# Patient Record
Sex: Male | Born: 1985 | Race: White | Hispanic: No | Marital: Single | Smoking: Never smoker
Health system: Southern US, Community
[De-identification: ages and names within clinical notes are randomized; demographics above are authoritative.]

## PROBLEM LIST (undated history)

## (undated) DIAGNOSIS — K219 Gastro-esophageal reflux disease without esophagitis: Secondary | ICD-10-CM

## (undated) DIAGNOSIS — F909 Attention-deficit hyperactivity disorder, unspecified type: Secondary | ICD-10-CM

## (undated) DIAGNOSIS — F319 Bipolar disorder, unspecified: Secondary | ICD-10-CM

---
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
## 2007-06-23 ENCOUNTER — Emergency Department (HOSPITAL_COMMUNITY): Admission: EM | Admit: 2007-06-23 | Discharge: 2007-06-23 | Payer: Self-pay | Admitting: Emergency Medicine

## 2007-09-30 ENCOUNTER — Emergency Department: Payer: Self-pay | Admitting: Emergency Medicine

## 2007-10-21 ENCOUNTER — Emergency Department (HOSPITAL_COMMUNITY): Admission: EM | Admit: 2007-10-21 | Discharge: 2007-10-21 | Payer: Self-pay | Admitting: *Deleted

## 2008-01-10 ENCOUNTER — Emergency Department (HOSPITAL_COMMUNITY): Admission: EM | Admit: 2008-01-10 | Discharge: 2008-01-10 | Payer: Self-pay | Admitting: Emergency Medicine

## 2008-01-30 ENCOUNTER — Emergency Department (HOSPITAL_COMMUNITY): Admission: EM | Admit: 2008-01-30 | Discharge: 2008-01-30 | Payer: Self-pay | Admitting: Emergency Medicine

## 2008-04-14 ENCOUNTER — Emergency Department: Payer: Self-pay | Admitting: Emergency Medicine

## 2008-07-17 ENCOUNTER — Emergency Department (HOSPITAL_COMMUNITY): Admission: EM | Admit: 2008-07-17 | Discharge: 2008-07-17 | Payer: Self-pay | Admitting: Family Medicine

## 2008-12-16 IMAGING — CR LEFT WRIST - COMPLETE 3+ VIEW
1 series · 4 of 4 positions shown · non-contrast
Comparison: none

REASON FOR EXAM: Fall
COMMENTS:   LMP: (Male)

PROCEDURE:     DXR - DXR WRIST LT COMP WITH OBLIQUES  - April 14, 2008  [DATE]
RESULT:     Images of the LEFT wrist demonstrate no fracture, dislocation or
radiopaque foreign body.

[Series 1: view not recorded · 0.17mm/px · 4 of 4 slices shown]
[im 1/4]
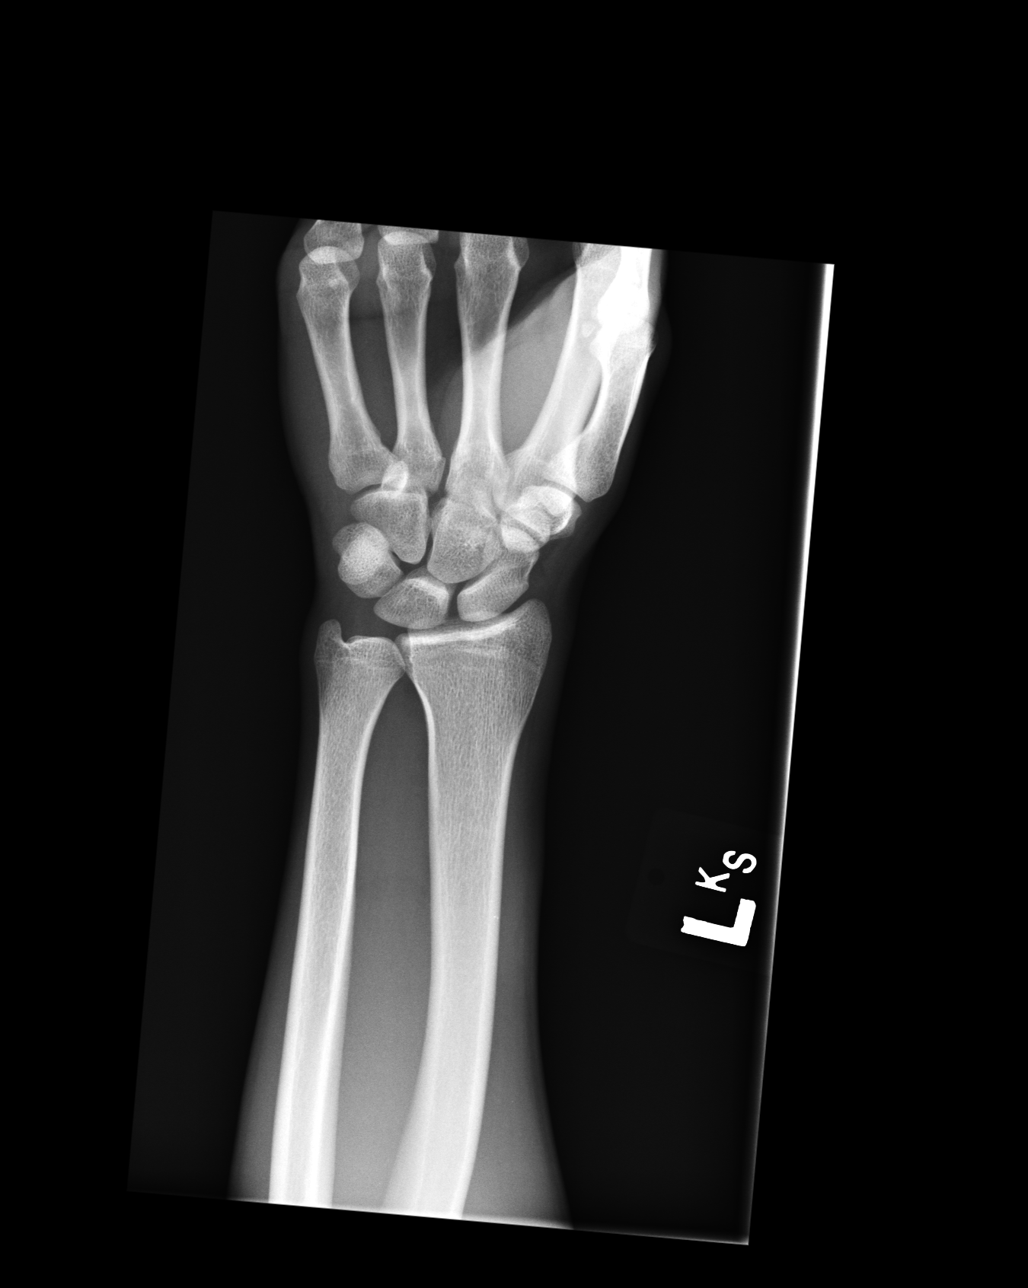
[im 2/4]
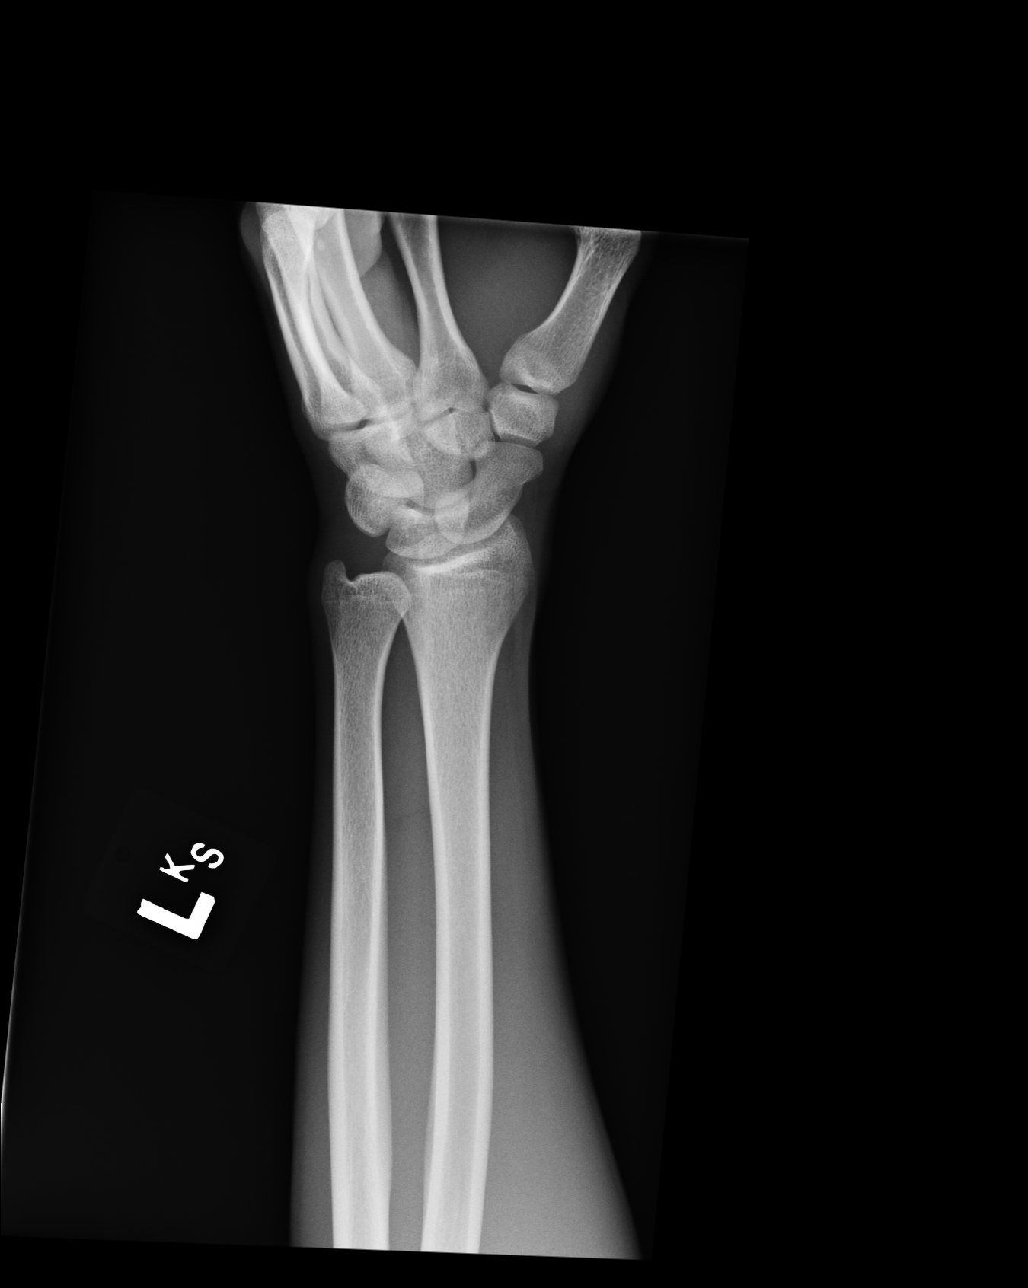
[im 3/4]
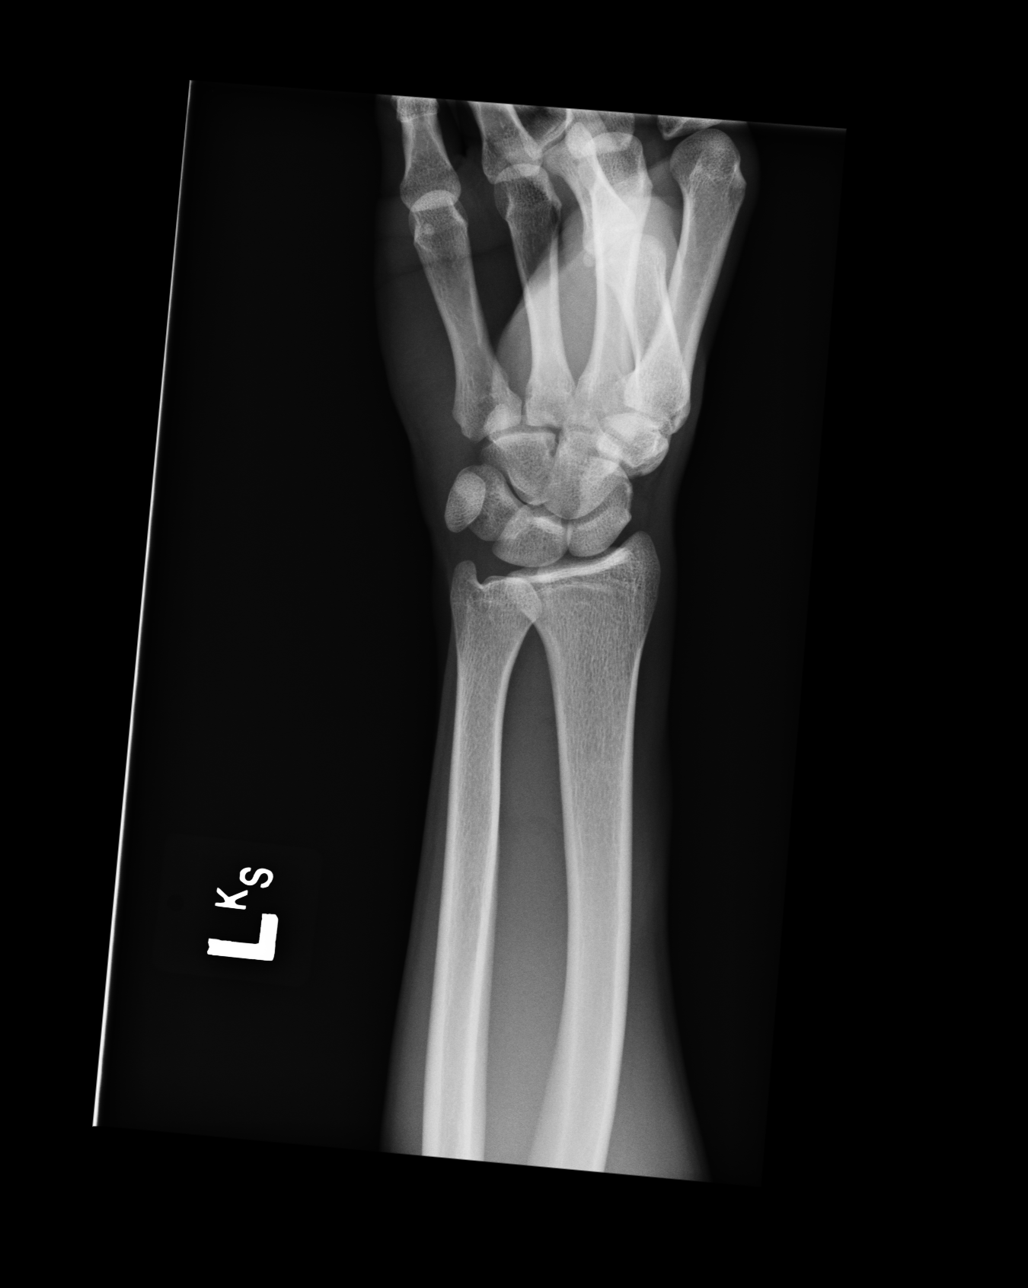
[im 4/4]
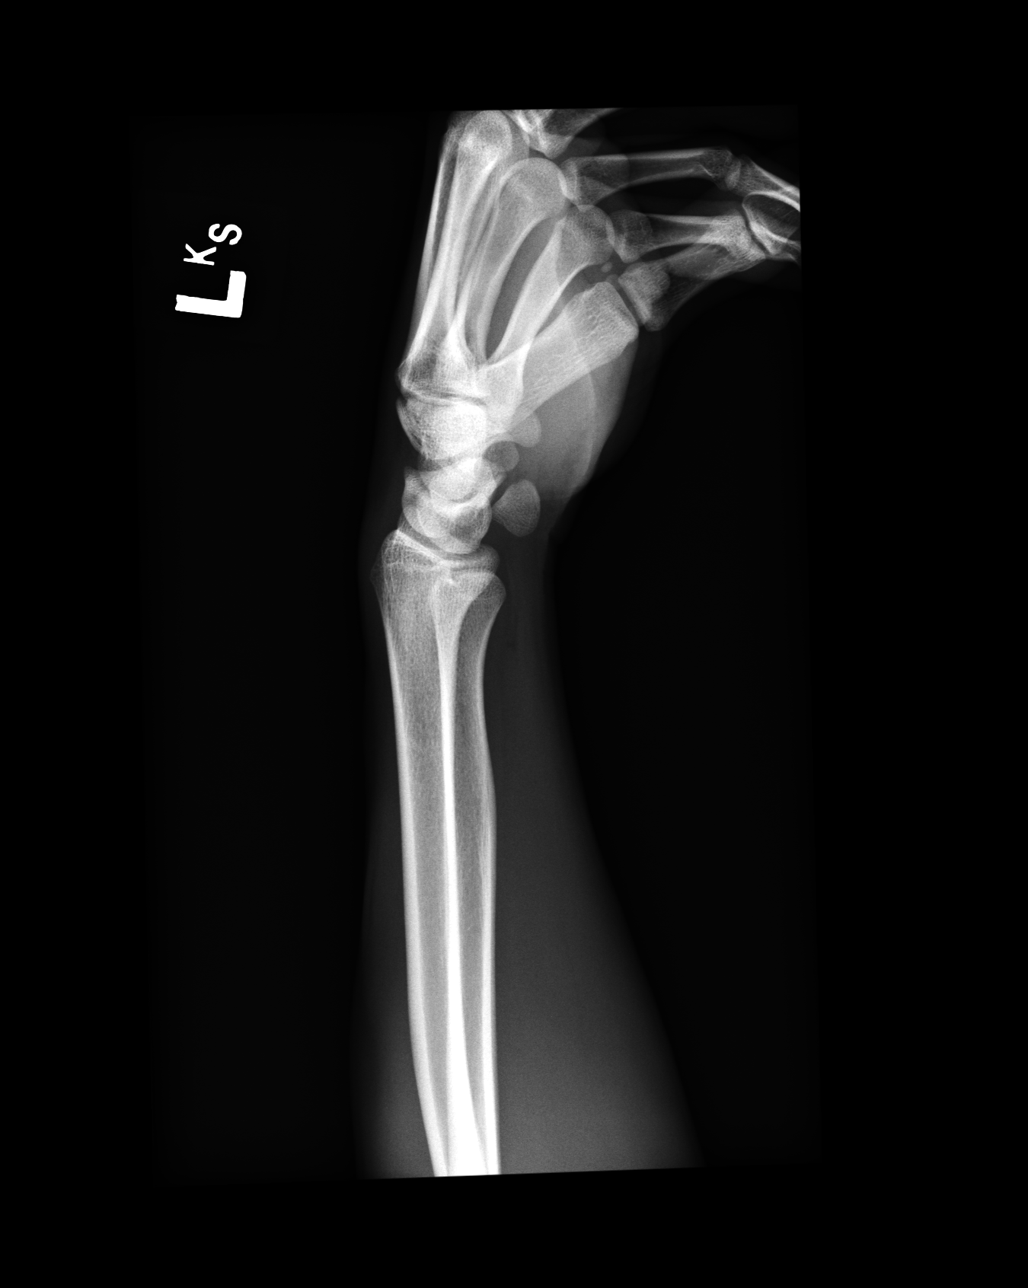

[4 of 4 positions shown; findings below may reference images not displayed]

IMPRESSION: No acute bony abnormality demonstrated.

## 2009-10-25 ENCOUNTER — Emergency Department: Payer: Self-pay | Admitting: Internal Medicine

## 2009-12-04 ENCOUNTER — Emergency Department: Payer: Self-pay | Admitting: Internal Medicine

## 2009-12-17 ENCOUNTER — Emergency Department: Payer: Self-pay | Admitting: Emergency Medicine

## 2009-12-24 ENCOUNTER — Emergency Department: Payer: Self-pay | Admitting: Unknown Physician Specialty

## 2011-04-06 ENCOUNTER — Emergency Department: Payer: Self-pay | Admitting: *Deleted

## 2011-09-28 ENCOUNTER — Ambulatory Visit: Payer: Self-pay

## 2012-01-25 ENCOUNTER — Emergency Department (HOSPITAL_COMMUNITY): Payer: Medicare Other

## 2012-01-25 ENCOUNTER — Encounter (HOSPITAL_COMMUNITY): Payer: Self-pay

## 2012-01-25 ENCOUNTER — Emergency Department (HOSPITAL_COMMUNITY)
Admission: EM | Admit: 2012-01-25 | Discharge: 2012-01-25 | Disposition: A | Discharge status: Home / Self Care | Payer: Medicare Other | Attending: Emergency Medicine | Admitting: Emergency Medicine

## 2012-01-25 DIAGNOSIS — M79609 Pain in unspecified limb: Secondary | ICD-10-CM | POA: Insufficient documentation

## 2012-01-25 DIAGNOSIS — M79642 Pain in left hand: Secondary | ICD-10-CM

## 2012-01-25 MED ORDER — OXYCODONE-ACETAMINOPHEN 5-325 MG PO TABS
1.0000 | ORAL_TABLET | Freq: Once | ORAL | Status: AC
  Administered 2012-01-25: 1 via ORAL
  Filled 2012-01-25: qty 1

## 2012-01-25 MED ORDER — IBUPROFEN 800 MG PO TABS
800.0000 mg | ORAL_TABLET | Freq: Once | ORAL | Status: AC
  Administered 2012-01-25: 800 mg via ORAL
  Filled 2012-01-25: qty 1

## 2012-01-25 MED ORDER — OXYCODONE-ACETAMINOPHEN 5-325 MG PO TABS: 2.0000 | ORAL_TABLET | ORAL | Status: AC | PRN

## 2012-01-25 NOTE — Discharge Instructions (Signed)
Splint, ice, elevate, pain medications, followup with hand Dr. in [place];  you'll need to call to make appointment
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
## 2012-01-25 NOTE — ED Notes (Signed)
CD and other xray info from urgent care office returned to patient to share with ortho MD

## 2012-03-26 ENCOUNTER — Emergency Department (HOSPITAL_COMMUNITY)
Admission: EM | Admit: 2012-03-26 | Discharge: 2012-03-26 | Disposition: A | Discharge status: Home / Self Care | Payer: Medicare Other | Attending: Emergency Medicine | Admitting: Emergency Medicine

## 2012-03-26 ENCOUNTER — Encounter (HOSPITAL_COMMUNITY): Payer: Self-pay | Admitting: *Deleted

## 2012-03-26 DIAGNOSIS — K029 Dental caries, unspecified: Secondary | ICD-10-CM | POA: Insufficient documentation

## 2012-03-26 MED ORDER — PENICILLIN V POTASSIUM 500 MG PO TABS: 500.0000 mg | ORAL_TABLET | Freq: Three times a day (TID) | ORAL | Status: AC

## 2012-03-26 MED ORDER — HYDROCODONE-ACETAMINOPHEN 5-325 MG PO TABS
1.0000 | ORAL_TABLET | Freq: Once | ORAL | Status: AC
  Administered 2012-03-26: 1 via ORAL
  Filled 2012-03-26: qty 1

## 2012-03-26 MED ORDER — HYDROCODONE-ACETAMINOPHEN 5-325 MG PO TABS: 1.0000 | ORAL_TABLET | ORAL | Status: AC | PRN

## 2012-03-26 MED ORDER — PENICILLIN V POTASSIUM 250 MG PO TABS
500.0000 mg | ORAL_TABLET | Freq: Once | ORAL | Status: AC
  Administered 2012-03-26: 500 mg via ORAL
  Filled 2012-03-26: qty 2

## 2012-03-26 NOTE — ED Notes (Signed)
Toothache to left upper since yesterday.

## 2012-03-26 NOTE — ED Provider Notes (Signed)
Medical screening examination/treatment/procedure(s) were performed by non-physician practitioner and as supervising physician I was immediately available for consultation/collaboration.   Stryker Veasey L Calani Gick, MD 03/26/12 1940 

## 2012-03-26 NOTE — Discharge Instructions (Signed)
Complete your entire course of antibiotics as prescribed.  You  may use the hydrocodone for pain relief but do not drive within 4 hours of taking as this will make you drowsy.  Avoid applying heat or ice to this abscess area which can worsen your symptoms.  You may use warm salt water swish and spit treatment or half peroxide and water swish and spit after meals to keep this area clean as discussed.  See the dental referral list given.

## 2012-03-26 NOTE — ED Provider Notes (Signed)
History     CSN: [id number]  Arrival date & time 03/26/12  1810   First MD Initiated Contact with Patient 03/26/12 1815      Chief Complaint  Patient presents with  . Dental Pain    (Consider location/radiation/quality/duration/timing/severity/associated sxs/prior treatment) HPI Comments: Michael Lamb has developed dental pain at the site of known old cavity since yesterday.  He denies fevers,  Chills,  Gingival edema or drainage from the tooth.  He does not have a dentist,  But plans to establish care this week with one.  Pain is constant and throbbing.  He has tried ibuprofen and bc powders without relief.  The history is provided by the patient.    History reviewed. No pertinent past medical history.  History reviewed. No pertinent past surgical history.  No family history on file.  History  Substance Use Topics  . Smoking status: Never Smoker   . Smokeless tobacco: Not on file  . Alcohol Use: No      Review of Systems  Constitutional: Negative for fever.  HENT: Positive for dental problem. Negative for sore throat, facial swelling, neck pain and neck stiffness.   Respiratory: Negative for shortness of breath.     Allergies  Review of patient's allergies indicates no known allergies.  Home Medications   Current Outpatient Rx  Name Route Sig Dispense Refill  . HYDROCODONE-ACETAMINOPHEN 5-325 MG PO TABS Oral Take 1 tablet by mouth every 4 (four) hours as needed for pain. 15 tablet 0  . PENICILLIN V POTASSIUM 500 MG PO TABS Oral Take 1 tablet (500 mg total) by mouth 3 (three) times daily. 30 tablet 0    BP 129/72  Pulse 62  Temp(Src) 98.3 F (36.8 C) (Oral)  Resp 16  SpO2 99%  Physical Exam  Constitutional: He is oriented to person, place, and time. He appears well-developed and well-nourished. No distress.  HENT:  Head: Normocephalic and atraumatic.  Right Ear: Tympanic membrane and external ear normal.  Left Ear: Tympanic membrane and external ear  normal.  Mouth/Throat: Oropharynx is clear and moist and mucous membranes are normal. No oral lesions. Dental abscesses present.         Pain in and surrounding this tooth with deep cavity noted.  Deep cavity also noted in right upper 2nd molar tooth.  No gingival edema or erythema.  Eyes: Conjunctivae are normal.  Neck: Normal range of motion. Neck supple.  Cardiovascular: Normal rate and normal heart sounds.   Pulmonary/Chest: Effort normal.  Abdominal: He exhibits no distension.  Musculoskeletal: Normal range of motion.  Lymphadenopathy:    He has no cervical adenopathy.  Neurological: He is alert and oriented to person, place, and time.  Skin: Skin is warm and dry. No erythema.  Psychiatric: He has a normal mood and affect.    ED Course  Procedures (including critical care time)  Labs Reviewed - No data to display No results found.   1. Dental caries       MDM  Probable simple dental pain although with old cavities and new pain,  Suspicious for possible early infection - will cover for this possibility with PCN.  Dental referrals given.  Hydrocodone.        Burgess Amor, [place] 03/26/12 [phone]

## 2012-03-27 ENCOUNTER — Emergency Department (HOSPITAL_COMMUNITY)
Admission: EM | Admit: 2012-03-27 | Discharge: 2012-03-27 | Disposition: A | Discharge status: Home / Self Care | Payer: Medicare Other | Attending: Emergency Medicine | Admitting: Emergency Medicine

## 2012-03-27 ENCOUNTER — Encounter (HOSPITAL_COMMUNITY): Payer: Self-pay | Admitting: *Deleted

## 2012-03-27 DIAGNOSIS — K047 Periapical abscess without sinus: Secondary | ICD-10-CM | POA: Insufficient documentation

## 2012-03-27 DIAGNOSIS — K0889 Other specified disorders of teeth and supporting structures: Secondary | ICD-10-CM

## 2012-03-27 MED ORDER — IBUPROFEN 600 MG PO TABS: 600.0000 mg | ORAL_TABLET | Freq: Three times a day (TID) | ORAL | Status: DC | PRN

## 2012-03-27 MED ORDER — OXYCODONE-ACETAMINOPHEN 5-325 MG PO TABS: 1.0000 | ORAL_TABLET | ORAL | Status: AC | PRN

## 2012-03-27 MED ORDER — OXYCODONE-ACETAMINOPHEN 5-325 MG PO TABS
1.0000 | ORAL_TABLET | Freq: Once | ORAL | Status: AC
  Administered 2012-03-27: 1 via ORAL
  Filled 2012-03-27: qty 1

## 2012-03-27 MED ORDER — IBUPROFEN 800 MG PO TABS
800.0000 mg | ORAL_TABLET | Freq: Once | ORAL | Status: AC
  Administered 2012-03-27: 800 mg via ORAL
  Filled 2012-03-27: qty 1

## 2012-03-27 NOTE — ED Notes (Signed)
PA at bedside to assess pt.

## 2012-03-27 NOTE — ED Provider Notes (Signed)
Medical screening examination/treatment/procedure(s) were performed by non-physician practitioner and as supervising physician I was immediately available for consultation/collaboration. Devoria Albe, MD, Armando Gang   Ward Givens, MD 03/27/12 [phone]

## 2012-03-27 NOTE — ED Notes (Signed)
Discharge instructions reviewed with pt, questions answered. Pt verbalized understanding.  

## 2012-03-27 NOTE — ED Notes (Signed)
Upper lt molar painful, pt says it is his wisdom tooth causing pain. Seen here for same. Says he does not feel any better and cannot get into dentists office until next week.

## 2012-03-27 NOTE — ED Provider Notes (Signed)
History     CSN: [id number]  Arrival date & time 03/27/12  2042   First MD Initiated Contact with Patient 03/27/12 2139      Chief Complaint  Patient presents with  . Dental Pain    (Consider location/radiation/quality/duration/timing/severity/associated sxs/prior treatment) HPI Comments: Leonardo Makris returns for further management of his acute dental pain.  He was seen here yesterday for evaluation of a dental fracture which has started to swell with drainage from around the tooth site.  He was prescribed hydrocodone which is not relieving his symptoms.  He is also started on penicillin and has had 3 full doses of this medication.  He denies fevers or chills, no throat swelling but he does have mild gingival edema surrounding his left upper third molar.  He has arranged a dental followup in [place] but this will not occur until next Thursday which is 10 days from now.    Patient is a 26 y.o. male presenting with tooth pain. The history is provided by the patient.  Dental PainPrimary symptoms do not include fever, shortness of breath or sore throat.  Additional symptoms do not include: facial swelling.    History reviewed. No pertinent past medical history.  History reviewed. No pertinent past surgical history.  Family History  Problem Relation Age of Onset  . Cancer Mother     History  Substance Use Topics  . Smoking status: Never Smoker   . Smokeless tobacco: Not on file  . Alcohol Use: No      Review of Systems  Constitutional: Negative for fever.  HENT: Positive for dental problem. Negative for sore throat, facial swelling, neck pain and neck stiffness.   Respiratory: Negative for shortness of breath.     Allergies  Review of patient's allergies indicates no known allergies.  Home Medications   Current Outpatient Rx  Name Route Sig Dispense Refill  . HYDROCODONE-ACETAMINOPHEN 5-325 MG PO TABS Oral Take 1 tablet by mouth every 4 (four) hours as needed for  pain. 15 tablet 0  . PENICILLIN V POTASSIUM 500 MG PO TABS Oral Take 1 tablet (500 mg total) by mouth 3 (three) times daily. 30 tablet 0  . IBUPROFEN 600 MG PO TABS Oral Take 1 tablet (600 mg total) by mouth every 8 (eight) hours as needed for pain. 30 tablet 0  . OXYCODONE-ACETAMINOPHEN 5-325 MG PO TABS Oral Take 1 tablet by mouth every 4 (four) hours as needed for pain. 15 tablet 0    BP 120/73  Pulse 76  Temp(Src) 98.1 F (36.7 C) (Oral)  Resp 24  Ht 5\' 7"  (1.702 m)  Wt 120 lb (54.432 kg)  BMI 18.79 kg/m2  SpO2 100%  Physical Exam  Constitutional: He is oriented to person, place, and time. He appears well-developed and well-nourished. No distress.  HENT:  Head: Normocephalic and atraumatic.  Right Ear: Tympanic membrane and external ear normal.  Left Ear: Tympanic membrane and external ear normal.  Mouth/Throat: Oropharynx is clear and moist and mucous membranes are normal. No oral lesions. Dental abscesses present.    Eyes: Conjunctivae are normal.  Neck: Normal range of motion. Neck supple.  Cardiovascular: Normal rate and normal heart sounds.   Pulmonary/Chest: Effort normal.  Abdominal: He exhibits no distension.  Musculoskeletal: Normal range of motion.  Lymphadenopathy:    He has no cervical adenopathy.  Neurological: He is alert and oriented to person, place, and time.  Skin: Skin is warm and dry. No erythema.  Psychiatric: He has a  normal mood and affect.    ED Course  Procedures (including critical care time)  Labs Reviewed - No data to display No results found.   1. Pain, dental       MDM  Pt was prescribed ibuprofen and Percocet.  He was advised to continue his penicillin and to follow up with his dentist next week as planned.        Burgess Amor, [place] 03/27/12 2210

## 2012-03-27 NOTE — Discharge Instructions (Signed)
Dental Pain A tooth ache may be caused by cavities (tooth decay). Cavities expose the nerve of the tooth to air and hot or cold temperatures. It may come from an infection or abscess (also called a boil or furuncle) around your tooth. It is also often caused by dental caries (tooth decay). This causes the pain you are having. DIAGNOSIS  Your caregiver can diagnose this problem by exam. TREATMENT   If caused by an infection, it may be treated with medications which kill germs (antibiotics) and pain medications as prescribed by your caregiver. Take medications as directed.   Only take over-the-counter or prescription medicines for pain, discomfort, or fever as directed by your caregiver.   Whether the tooth ache today is caused by infection or dental disease, you should see your dentist as soon as possible for further care.  SEEK MEDICAL CARE IF: The exam and treatment you received today has been provided on an emergency basis only. This is not a substitute for complete medical or dental care. If your problem worsens or new problems (symptoms) appear, and you are unable to meet with your dentist, call or return to this location. SEEK IMMEDIATE MEDICAL CARE IF:   You have a fever.   You develop redness and swelling of your face, jaw, or neck.   You are unable to open your mouth.   You have severe pain uncontrolled by pain medicine.  MAKE SURE YOU:   Understand these instructions.   Will watch your condition.   Will get help right away if you are not doing well or get worse.  Document Released: 10/04/2005 Document Revised: 09/23/2011 Document Reviewed: 05/22/2008 [facility] Patient Information 2012 [place], [place].   You may take the oxycodone prescribed for pain relief.  This will make you drowsy - do not drive within 4 hours of taking this medication.

## 2012-04-12 ENCOUNTER — Emergency Department (HOSPITAL_COMMUNITY)
Admission: EM | Admit: 2012-04-12 | Discharge: 2012-04-12 | Disposition: A | Discharge status: Home / Self Care | Payer: Medicare Other | Attending: Emergency Medicine | Admitting: Emergency Medicine

## 2012-04-12 ENCOUNTER — Encounter (HOSPITAL_COMMUNITY): Payer: Self-pay | Admitting: *Deleted

## 2012-04-12 DIAGNOSIS — F319 Bipolar disorder, unspecified: Secondary | ICD-10-CM | POA: Insufficient documentation

## 2012-04-12 DIAGNOSIS — Z760 Encounter for issue of repeat prescription: Secondary | ICD-10-CM | POA: Insufficient documentation

## 2012-04-12 DIAGNOSIS — F909 Attention-deficit hyperactivity disorder, unspecified type: Secondary | ICD-10-CM | POA: Insufficient documentation

## 2012-04-12 HISTORY — DX: Bipolar disorder, unspecified: F31.9

## 2012-04-12 HISTORY — DX: Attention-deficit hyperactivity disorder, unspecified type: F90.9

## 2012-04-12 MED ORDER — CLONAZEPAM 0.5 MG PO TABS: 0.5000 mg | ORAL_TABLET | Freq: Two times a day (BID) | ORAL | Status: AC | PRN

## 2012-04-12 MED ORDER — RISPERIDONE 1 MG PO TABS: 1.0000 mg | ORAL_TABLET | Freq: Two times a day (BID) | ORAL | Status: AC

## 2012-04-12 MED ORDER — DIVALPROEX SODIUM ER 500 MG PO TB24: 500.0000 mg | ORAL_TABLET | Freq: Every day | ORAL | Status: AC

## 2012-04-12 NOTE — Discharge Instructions (Signed)
Medication Refill, Emergency Department  We have refilled your medication today as a courtesy to you. It is best for your medical care, however, to take care of getting refills done through your primary caregiver's office. They have your records and can do a better job of follow-up than we can in the emergency department.  On maintenance medications, we often only prescribe enough medications to get you by until you are able to see your regular caregiver. This is a more expensive way to refill medications.  In the future, please plan for refills so that you will not have to use the emergency department for this.  Thank you for your help. Your help allows us to better take care of the daily emergencies that enter our department.  Document Released: 01/21/2004 Document Revised: 09/23/2011 Document Reviewed: 10/04/2005  [employer] Patient Information 2012 [employer].

## 2012-04-12 NOTE — ED Notes (Signed)
Out of his psychiatric meds.  Moved from [place] 1 month ago.

## 2012-04-12 NOTE — ED Notes (Signed)
Pt reports he moved from [place] about 1 month ago.  States that he does have appointment with mental health department in [facility], but unable to be seen and get medication for 2-3 weeks.  States that he needs refills for Klonipin, Depakote, and Risperdal.

## 2012-04-14 NOTE — ED Provider Notes (Signed)
History     CSN: [id number]  Arrival date & time 04/12/12  2041   First MD Initiated Contact with Patient 04/12/12 2103      Chief Complaint  Patient presents with  . Medication Refill    (Consider location/radiation/quality/duration/timing/severity/associated sxs/prior treatment) HPI Comments: Michael Lamb presents for assistance with medication refills.  Michael Lamb has moved to this area from [place] over 1 month ago and is anticipating establishing care with [facility] in 3 weeks.  In the interim,  Michael Lamb has run out of his depakote,  risperdal and his klonopin which Michael Lamb takes for adhd and bipolar disorder.  Michael Lamb reports feeling more withdrawn,  Stating Michael Lamb is interacting with family less,  And "just wants to be left alone" which is not a good feeling for him.  Michael Lamb denies homicidal or suicidal ideation,  But has been less tolerant of others since running out of his medicines.  Michael Lamb has no physical or other complaint today.  The history is provided by the patient.    Past Medical History  Diagnosis Date  . ADHD (attention deficit hyperactivity disorder)   . Bipolar 1 disorder     History reviewed. No pertinent past surgical history.  Family History  Problem Relation Age of Onset  . Cancer Mother     History  Substance Use Topics  . Smoking status: Never Smoker   . Smokeless tobacco: Not on file  . Alcohol Use: No      Review of Systems  Psychiatric/Behavioral: Negative for hallucinations, confusion, disturbed wake/sleep cycle, self-injury and agitation. The patient is not hyperactive.   All other systems reviewed and are negative.    Allergies  Review of patient's allergies indicates no known allergies.  Home Medications   Current Outpatient Rx  Name Route Sig Dispense Refill  . CLONAZEPAM 0.5 MG PO TABS Oral Take 1 tablet (0.5 mg total) by mouth 2 (two) times daily as needed for anxiety. 24 tablet 0  . DIVALPROEX SODIUM ER 500 MG PO TB24 Oral Take 1 tablet  (500 mg total) by mouth daily. 30 tablet 0  . RISPERIDONE 1 MG PO TABS Oral Take 1 tablet (1 mg total) by mouth 2 (two) times daily. 50 tablet 0    BP 125/71  Pulse 103  Temp 98.6 F (37 C) (Oral)  Resp 20  Ht 5\' 9"  (1.753 m)  Wt 120 lb (54.432 kg)  BMI 17.72 kg/m2  SpO2 99%  Physical Exam  Nursing note and vitals reviewed. Constitutional: Michael Lamb is oriented to person, place, and time. Michael Lamb appears well-developed and well-nourished.  HENT:  Head: Normocephalic and atraumatic.  Eyes: Conjunctivae are normal. Pupils are equal, round, and reactive to light.  Neck: Normal range of motion.  Cardiovascular: Normal rate and regular rhythm.   Pulmonary/Chest: Effort normal and breath sounds normal.  Musculoskeletal: Normal range of motion.  Neurological: Michael Lamb is alert and oriented to person, place, and time.  Skin: Skin is warm and dry.  Psychiatric: Michael Lamb has a normal mood and affect. His behavior is normal. Judgment and thought content normal.    ED Course  Procedures (including critical care time)  Labs Reviewed - No data to display No results found.   1. Medication refill       MDM  Pt given refills of his medications to cover until Michael Lamb is able to establish primary medical care (Michael Lamb presents with his empty medicine bottles and doses obtained from these).  The patient appears reasonably screened and/or  stabilized for discharge and I doubt any other medical condition or other [facility] requiring further screening, evaluation, or treatment in the ED at this time prior to discharge.         Burgess Amor, PA 04/14/12 1520

## 2012-04-24 NOTE — ED Provider Notes (Signed)
Medical screening examination/treatment/procedure(s) were performed by non-physician practitioner and as supervising physician I was immediately available for consultation/collaboration.  Donnetta Hutching, MD 04/24/12 [phone]

## 2012-07-02 ENCOUNTER — Emergency Department: Payer: Self-pay | Admitting: Emergency Medicine

## 2012-07-06 LAB — BETA STREP CULTURE(ARMC)

## 2012-09-27 IMAGING — CR DG HAND COMPLETE 3+V*L*
3 series · 3 of 3 positions shown · non-contrast
Comparison: 01/25/2012 at 8103 hours.

CLINICAL DATA: Trauma and pain.

LEFT HAND - COMPLETE 3+ VIEW

[view not recorded (1 of 3)]
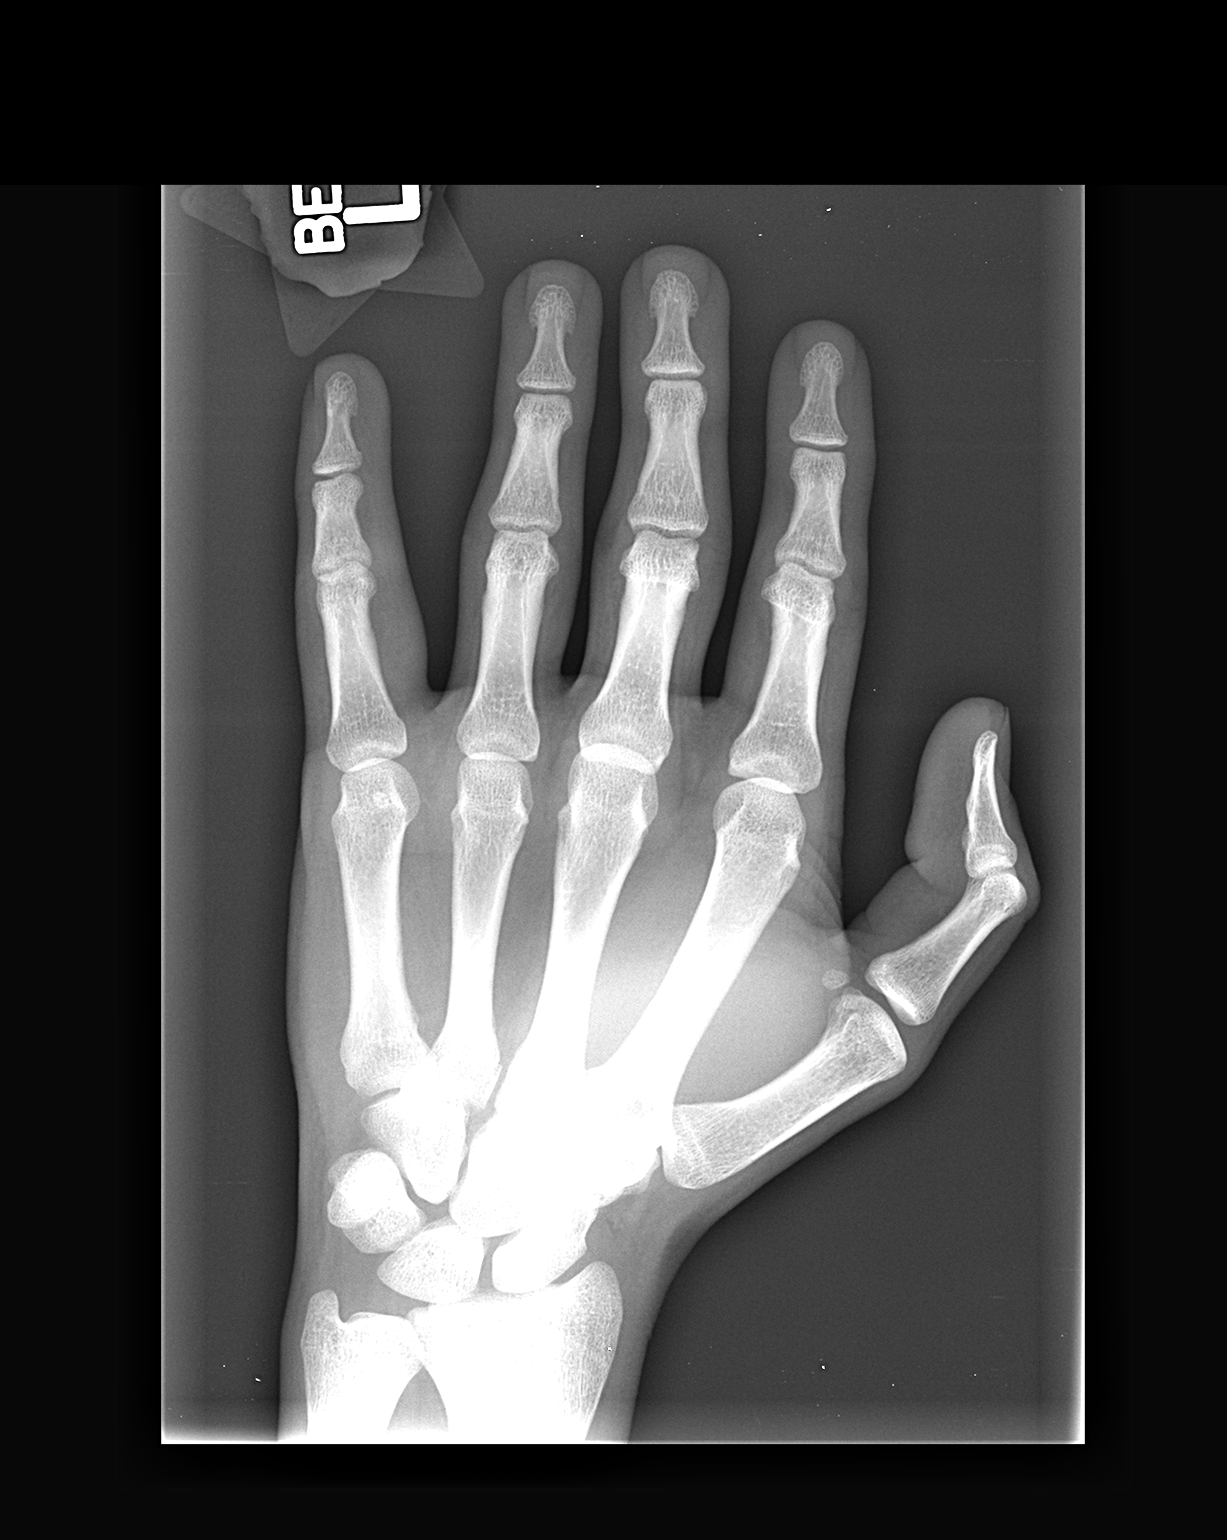

[view not recorded (2 of 3)]
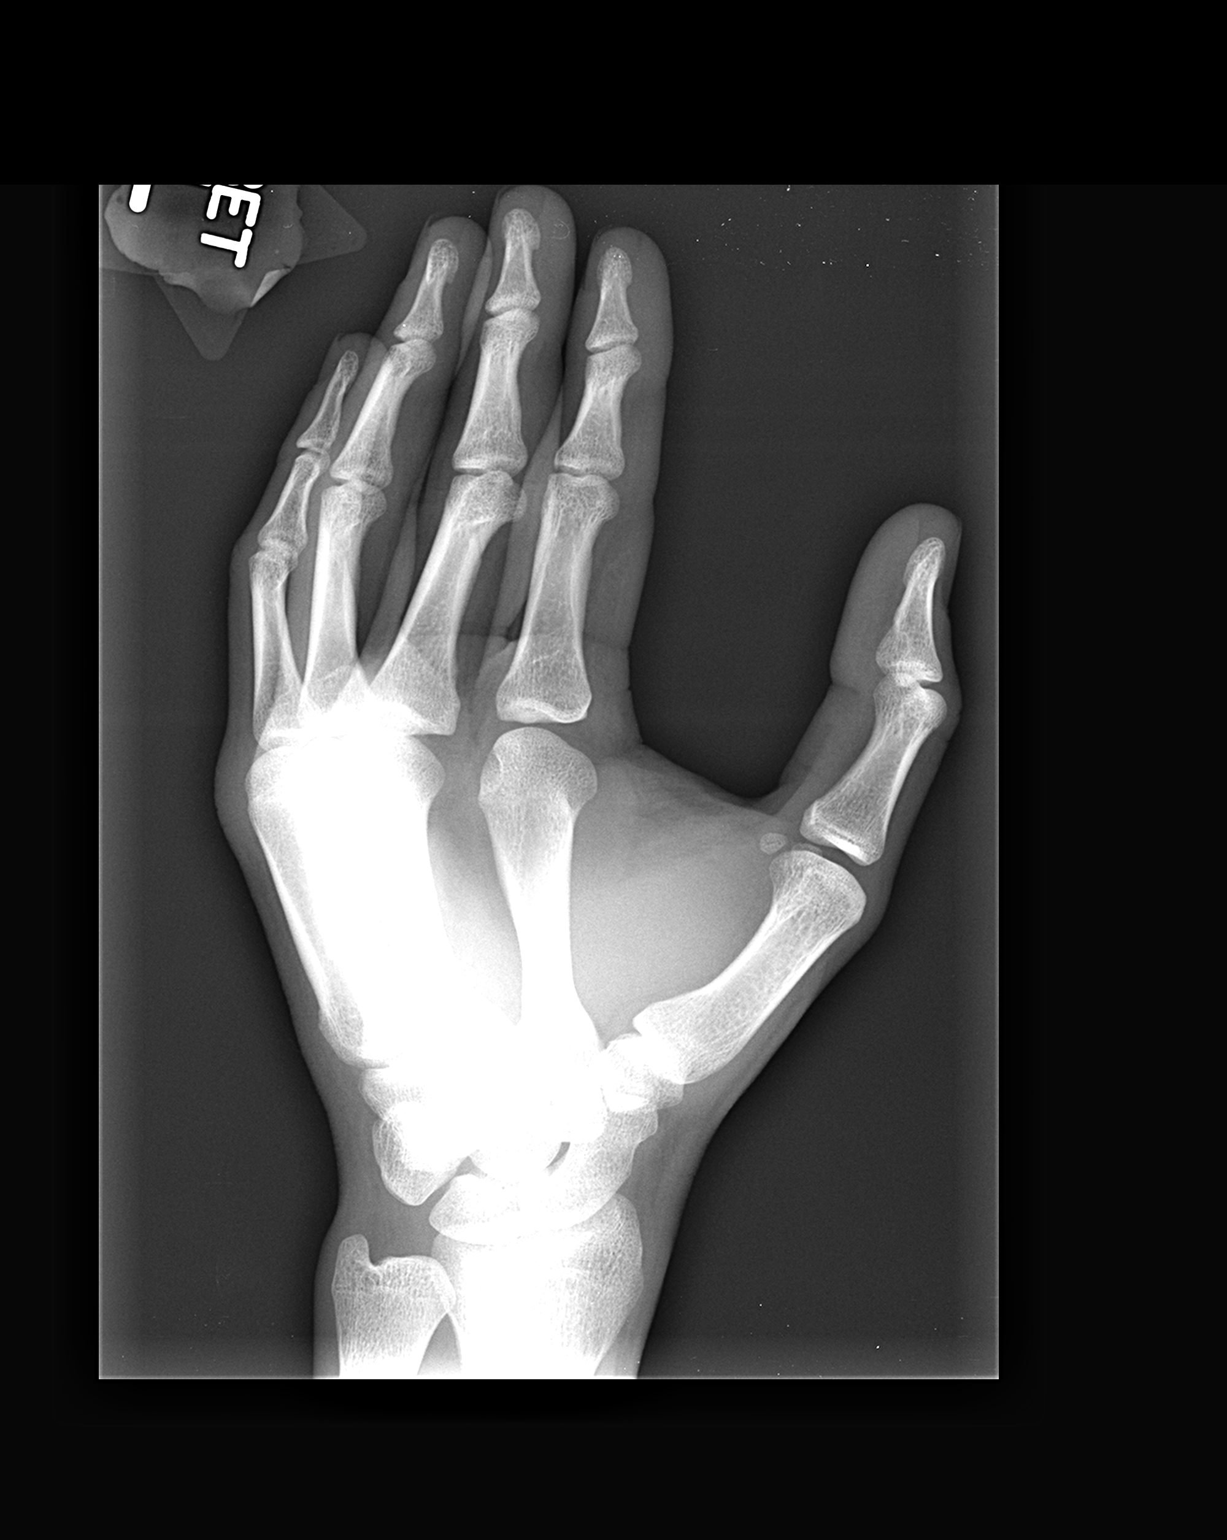

[view not recorded (3 of 3)]
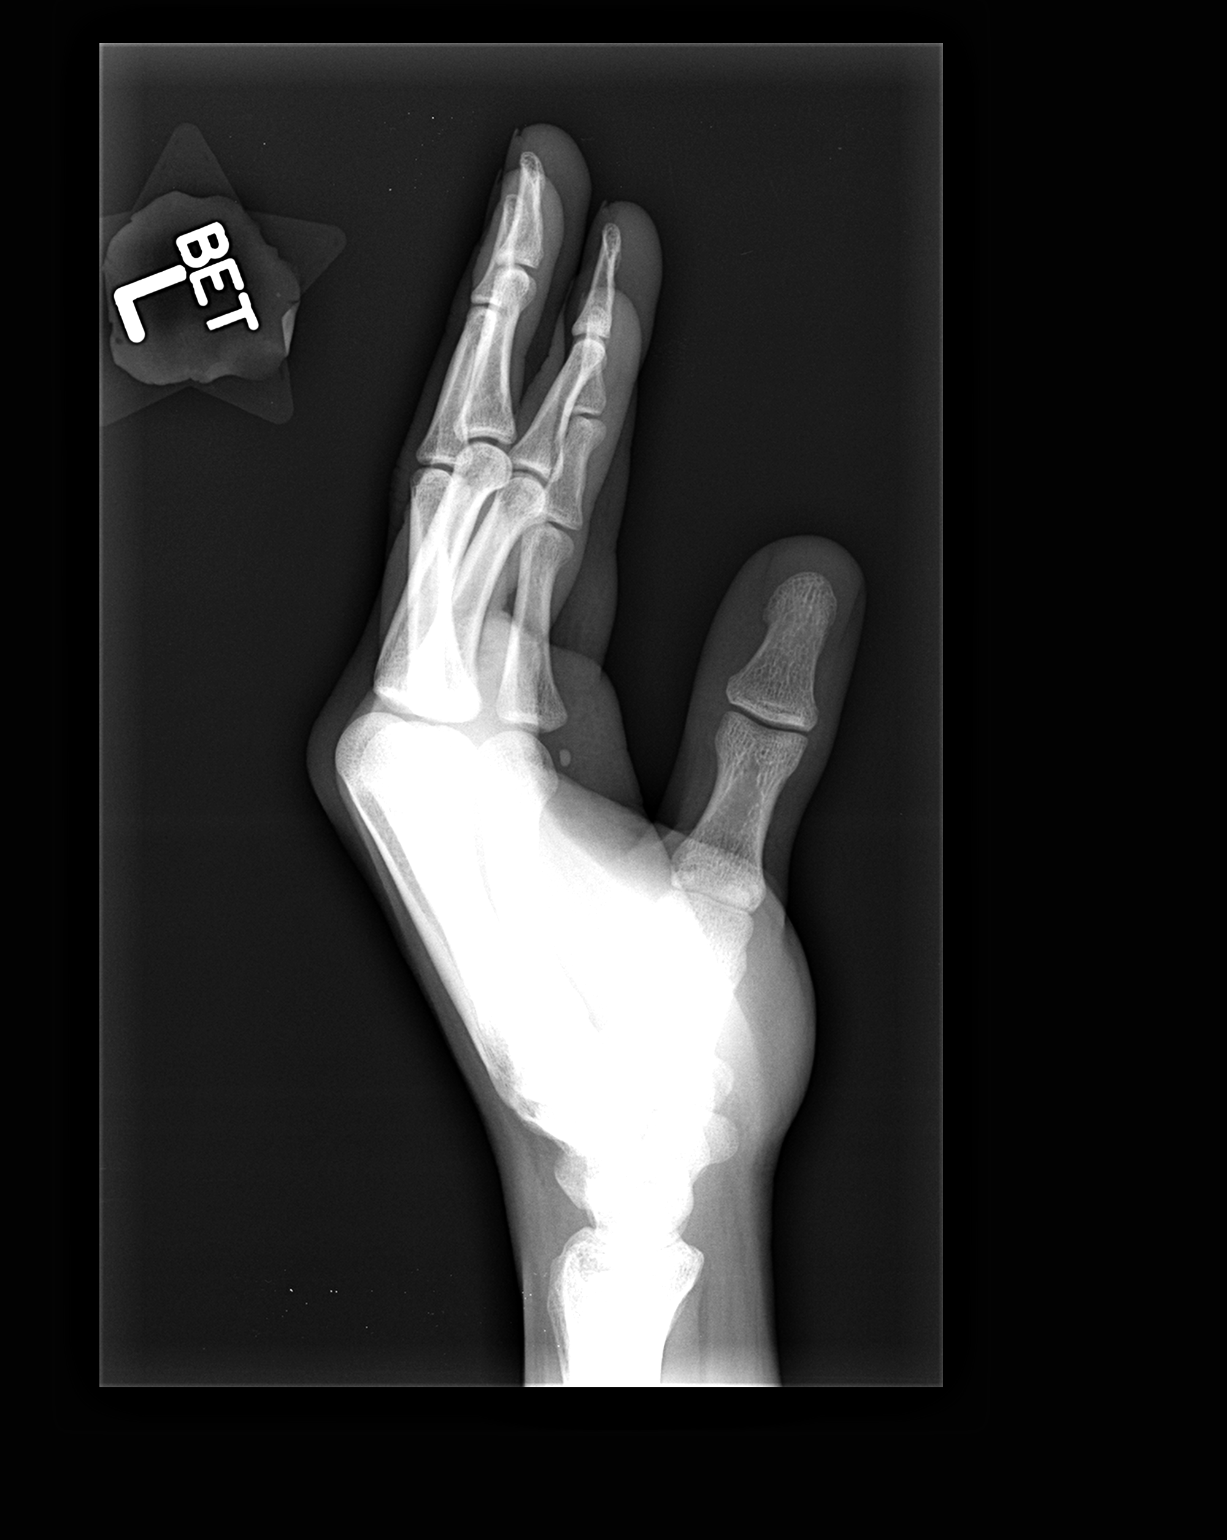

[3 of 3 positions shown; findings below may reference images not displayed]

FINDINGS: The frontal view demonstrates no acute fracture or
dislocation.  The attempted oblique and lateral views are both
suboptimal, secondary patient positioning.  Extensive overlap of
fingers on the lateral.  Given these factors, no acute osseous
finding. No significant soft tissue swelling.
IMPRESSION: No acute osseous abnormality.

Suboptimal patient positioning on lateral and oblique views.
Positioning is slightly improved from images of earlier in the
evening.

## 2013-06-07 ENCOUNTER — Other Ambulatory Visit (HOSPITAL_COMMUNITY): Payer: Self-pay | Admitting: Orthopaedic Surgery

## 2013-06-07 DIAGNOSIS — M545 Low back pain: Secondary | ICD-10-CM

## 2013-06-12 ENCOUNTER — Ambulatory Visit (HOSPITAL_COMMUNITY)
Admission: RE | Admit: 2013-06-12 | Discharge: 2013-06-12 | Disposition: A | Discharge status: Home / Self Care | Payer: Medicare Other | Source: Ambulatory Visit | Attending: Orthopaedic Surgery | Admitting: Orthopaedic Surgery

## 2013-06-12 DIAGNOSIS — M51379 Other intervertebral disc degeneration, lumbosacral region without mention of lumbar back pain or lower extremity pain: Secondary | ICD-10-CM | POA: Insufficient documentation

## 2013-06-12 DIAGNOSIS — M5126 Other intervertebral disc displacement, lumbar region: Secondary | ICD-10-CM | POA: Insufficient documentation

## 2013-06-12 DIAGNOSIS — M5137 Other intervertebral disc degeneration, lumbosacral region: Secondary | ICD-10-CM | POA: Insufficient documentation

## 2013-06-12 DIAGNOSIS — M545 Low back pain, unspecified: Secondary | ICD-10-CM | POA: Insufficient documentation

## 2014-01-28 ENCOUNTER — Emergency Department (HOSPITAL_COMMUNITY)
Admission: EM | Admit: 2014-01-28 | Discharge: 2014-01-28 | Disposition: A | Discharge status: Home / Self Care | Payer: Medicare Other | Attending: Emergency Medicine | Admitting: Emergency Medicine

## 2014-01-28 ENCOUNTER — Encounter (HOSPITAL_COMMUNITY): Payer: Self-pay | Admitting: Emergency Medicine

## 2014-01-28 DIAGNOSIS — L259 Unspecified contact dermatitis, unspecified cause: Secondary | ICD-10-CM

## 2014-01-28 DIAGNOSIS — L255 Unspecified contact dermatitis due to plants, except food: Secondary | ICD-10-CM | POA: Insufficient documentation

## 2014-01-28 DIAGNOSIS — F319 Bipolar disorder, unspecified: Secondary | ICD-10-CM | POA: Insufficient documentation

## 2014-01-28 MED ORDER — PREDNISONE 20 MG PO TABS: 40.0000 mg | ORAL_TABLET | Freq: Every day | ORAL | Status: AC

## 2014-01-28 MED ORDER — HYDROXYZINE HCL 25 MG PO TABS: 25.0000 mg | ORAL_TABLET | Freq: Four times a day (QID) | ORAL | Status: AC

## 2014-01-28 NOTE — ED Provider Notes (Signed)
CSN: [id number]     Arrival date & time 01/28/14  1032 History  This chart was scribed for non-physician practitioner, Trixie DredgeEmily Teren Franckowiak, PA-C working with Gerhard Munchobert Lockwood, MD by Greggory StallionKayla Andersen, ED scribe. This patient was seen in room TR10C/TR10C and the patient's care was started at 11:20 AM.   Chief Complaint  Patient presents with  . Poison Ivy   The history is provided by the patient. No language interpreter was used.   HPI Comments: Michael Lamb is a 28 y.o. male who presents to the Emergency Department complaining of an itchy rash to his groin area and bilateral arms that started 2 days ago. He states it is from poison oak. Pt was evaluated for the same 2 days ago and started on prednisone with little relief. He has also used hydrocortisone cream and taken benadryl with no relief. Denies fever.   Past Medical History  Diagnosis Date  . ADHD (attention deficit hyperactivity disorder)   . Bipolar 1 disorder    History reviewed. No pertinent past surgical history. Family History  Problem Relation Age of Onset  . Cancer Mother    History  Substance Use Topics  . Smoking status: Never Smoker   . Smokeless tobacco: Current User  . Alcohol Use: No    Review of Systems  Constitutional: Negative for fever.  Skin: Positive for rash.  All other systems reviewed and are negative.  Allergies  Review of patient's allergies indicates no known allergies.  Home Medications   Current Outpatient Rx  Name  Route  Sig  Dispense  Refill  . EXPIRED: clonazePAM (KLONOPIN) 0.5 MG tablet   Oral   Take 1 tablet (0.5 mg total) by mouth 2 (two) times daily as needed for anxiety.   24 tablet   0   . EXPIRED: divalproex (DEPAKOTE ER) 500 MG 24 hr tablet   Oral   Take 1 tablet (500 mg total) by mouth daily.   30 tablet   0   . EXPIRED: risperiDONE (RISPERDAL) 1 MG tablet   Oral   Take 1 tablet (1 mg total) by mouth 2 (two) times daily.   50 tablet   0    BP 134/82  Pulse 93  Temp(Src)  98.9 F (37.2 C) (Oral)  Resp 18  Ht 5\' 9"  (1.753 m)  Wt 148 lb (67.132 kg)  BMI 21.85 kg/m2  SpO2 99%  Physical Exam  Nursing note and vitals reviewed. Constitutional: He appears well-developed and well-nourished. No distress.  HENT:  Head: Normocephalic and atraumatic.  Neck: Neck supple.  Pulmonary/Chest: Effort normal.  Neurological: He is alert.  Skin: He is not diaphoretic.  Mildly excoriated papular rash on left forearm, left hand, right hand, suprapubic area and a small amount on scrotum. No tenderness. No discharge. No surrounding edema, erythema or warmth. Lesions are scattered but occasionally in linear patterns.     ED Course  Procedures (including critical care time)  DIAGNOSTIC STUDIES: Oxygen Saturation is 99% on RA, normal by my interpretation.    COORDINATION OF CARE: 11:23 AM-Discussed treatment plan which includes changing prednisone dosage and starting hydroxyzine with pt at bedside and pt agreed to plan.   Labs Review Labs Reviewed - No data to display Imaging Review No results found.   EKG Interpretation None      MDM   Final diagnoses:  Contact dermatitis    Pt with previously diagnosed contact dermatitis from known exposure to poison oak, symptoms not improving and subjectively worsening over two  days.  I explained to patient that he needs to be on steroids for many days before his symptoms will resolve, I have changed him from steroid taper pack to 40mg  daily for 6 more days.  Also benadryl not helping with itching at home, have added vistaril.  Discussed findings, treatment, and follow up  with patient.  Pt given return precautions.  Pt verbalizes understanding and agrees with plan.      I personally performed the services described in this documentation, which was scribed in my presence. The recorded information has been reviewed and is accurate.  Trixie Dredgemily Ketina Mars, PA-C 01/28/14 1219

## 2014-01-28 NOTE — ED Notes (Signed)
Pt sts possible poison oak to groin area and arms with itching and rash; pt sts started on pred 2 days ago but still having sx

## 2014-01-28 NOTE — Discharge Instructions (Signed)
Read the information below.  Use the prescribed medication as directed.  Please discuss all new medications with your pharmacist.  You may return to the Emergency Department at any time for worsening condition or any new symptoms that concern you.  If you develop redness, swelling, pus draining from the wound, or fevers greater than 100.4, return to the ER immediately for a recheck.     Contact Dermatitis Contact dermatitis is a reaction to certain substances that touch the skin. Contact dermatitis can be either irritant contact dermatitis or allergic contact dermatitis. Irritant contact dermatitis does not require previous exposure to the substance for a reaction to occur.Allergic contact dermatitis only occurs if you have been exposed to the substance before. Upon a repeat exposure, your body reacts to the substance.  CAUSES  Many substances can cause contact dermatitis. Irritant dermatitis is most commonly caused by repeated exposure to mildly irritating substances, such as:  Makeup.  Soaps.  Detergents.  Bleaches.  Acids.  Metal salts, such as nickel. Allergic contact dermatitis is most commonly caused by exposure to:  Poisonous plants.  Chemicals (deodorants, shampoos).  Jewelry.  Latex.  Neomycin in triple antibiotic cream.  Preservatives in products, including clothing. SYMPTOMS  The area of skin that is exposed may develop:  Dryness or flaking.  Redness.  Cracks.  Itching.  Pain or a burning sensation.  Blisters. With allergic contact dermatitis, there may also be swelling in areas such as the eyelids, mouth, or genitals.  DIAGNOSIS  Your caregiver can usually tell what the problem is by doing a physical exam. In cases where the cause is uncertain and an allergic contact dermatitis is suspected, a patch skin test may be performed to help determine the cause of your dermatitis. TREATMENT Treatment includes protecting the skin from further contact with the  irritating substance by avoiding that substance if possible. Barrier creams, powders, and gloves may be helpful. Your caregiver may also recommend:  Steroid creams or ointments applied 2 times daily. For best results, soak the rash area in cool water for 20 minutes. Then apply the medicine. Cover the area with a plastic wrap. You can store the steroid cream in the refrigerator for a "chilly" effect on your rash. That may decrease itching. Oral steroid medicines may be needed in more severe cases.  Antibiotics or antibacterial ointments if a skin infection is present.  Antihistamine lotion or an antihistamine taken by mouth to ease itching.  Lubricants to keep moisture in your skin.  Burow's solution to reduce redness and soreness or to dry a weeping rash. Mix one packet or tablet of solution in 2 cups cool water. Dip a clean washcloth in the mixture, wring it out a bit, and put it on the affected area. Leave the cloth in place for 30 minutes. Do this as often as possible throughout the day.  Taking several cornstarch or baking soda baths daily if the area is too large to cover with a washcloth. Harsh chemicals, such as alkalis or acids, can cause skin damage that is like a burn. You should flush your skin for 15 to 20 minutes with cold water after such an exposure. You should also seek immediate medical care after exposure. Bandages (dressings), antibiotics, and pain medicine may be needed for severely irritated skin.  HOME CARE INSTRUCTIONS  Avoid the substance that caused your reaction.  Keep the area of skin that is affected away from hot water, soap, sunlight, chemicals, acidic substances, or anything else that would irritate  your skin.  Do not scratch the rash. Scratching may cause the rash to become infected.  You may take cool baths to help stop the itching.  Only take over-the-counter or prescription medicines as directed by your caregiver.  See your caregiver for follow-up care as  directed to make sure your skin is healing properly. SEEK MEDICAL CARE IF:   Your condition is not better after 3 days of treatment.  You seem to be getting worse.  You see signs of infection such as swelling, tenderness, redness, soreness, or warmth in the affected area.  You have any problems related to your medicines. Document Released: 10/01/2000 Document Revised: 12/27/2011 Document Reviewed: 03/09/2011 [facility] Patient Information 2014 [place], [place].

## 2014-01-28 NOTE — ED Provider Notes (Signed)
Medical screening examination/treatment/procedure(s) were performed by non-physician practitioner and as supervising physician I was immediately available for consultation/collaboration.  Sabrena Gavitt, MD 01/28/14 1602 

## 2021-09-06 ENCOUNTER — Emergency Department (HOSPITAL_COMMUNITY)
Admission: EM | Admit: 2021-09-06 | Discharge: 2021-09-06 | Disposition: A | Discharge status: Home / Self Care | Payer: 59 | Attending: Emergency Medicine | Admitting: Emergency Medicine

## 2021-09-06 ENCOUNTER — Other Ambulatory Visit: Payer: Self-pay

## 2021-09-06 ENCOUNTER — Encounter (HOSPITAL_COMMUNITY): Payer: Self-pay

## 2021-09-06 DIAGNOSIS — F17220 Nicotine dependence, chewing tobacco, uncomplicated: Secondary | ICD-10-CM | POA: Diagnosis not present

## 2021-09-06 DIAGNOSIS — K047 Periapical abscess without sinus: Secondary | ICD-10-CM | POA: Diagnosis not present

## 2021-09-06 DIAGNOSIS — K0889 Other specified disorders of teeth and supporting structures: Secondary | ICD-10-CM

## 2021-09-06 HISTORY — DX: Gastro-esophageal reflux disease without esophagitis: K21.9

## 2021-09-06 MED ORDER — HYDROCODONE-ACETAMINOPHEN 5-325 MG PO TABS: 2.0000 | ORAL_TABLET | ORAL | 0 refills | Status: AC | PRN

## 2021-09-06 MED ORDER — ONDANSETRON 8 MG PO TBDP
8.0000 mg | ORAL_TABLET | Freq: Once | ORAL | Status: AC
  Administered 2021-09-06: 8 mg via ORAL
  Filled 2021-09-06: qty 1

## 2021-09-06 MED ORDER — ONDANSETRON 4 MG PO TBDP: 4.0000 mg | ORAL_TABLET | Freq: Three times a day (TID) | ORAL | 0 refills | Status: AC | PRN

## 2021-09-06 MED ORDER — HYDROMORPHONE HCL 1 MG/ML IJ SOLN
1.0000 mg | Freq: Once | INTRAMUSCULAR | Status: AC
  Administered 2021-09-06: 1 mg via INTRAMUSCULAR
  Filled 2021-09-06: qty 1

## 2021-09-06 MED ORDER — AMOXICILLIN-POT CLAVULANATE 875-125 MG PO TABS
1.0000 | ORAL_TABLET | Freq: Once | ORAL | Status: AC
  Administered 2021-09-06: 1 via ORAL
  Filled 2021-09-06: qty 1

## 2021-09-06 MED ORDER — AMOXICILLIN-POT CLAVULANATE 875-125 MG PO TABS: 1.0000 | ORAL_TABLET | Freq: Two times a day (BID) | ORAL | 0 refills | Status: AC

## 2021-09-06 NOTE — ED Provider Notes (Signed)
[facility] EMERGENCY DEPARTMENT Provider Note   CSN: [id number] Arrival date & time: 09/06/21  1712     History Chief Complaint  Patient presents with   Dental Pain    Michael Lamb is a 35 y.o. male.  HPI  Patient presents with dental pain.  Happened about 3 or 4 days ago, the pains been constant.  He has been trying naproxen twice daily, this is not helping with the pain.  Denies any drainage, the pain is worse with swallowing.  He is able to swallow without any issues.  No systemic symptoms, no nausea or vomiting or fevers.  Has not seen dentist in over a year, reports having a bad reaction to a molar being pulled about a year ago having intermittent pain since then.  This pain is acute and feels different.  Feels like there is some swelling on the right side of his jaw as well.  Past Medical History:  Diagnosis Date   ADHD (attention deficit hyperactivity disorder)    Bipolar 1 disorder (HCC)    GERD (gastroesophageal reflux disease)     There are no problems to display for this patient.   History reviewed. No pertinent surgical history.     Family History  Problem Relation Age of Onset   Cancer Mother     Social History   Tobacco Use   Smoking status: Never   Smokeless tobacco: Current  Substance Use Topics   Alcohol use: No   Drug use: No    Home Medications Prior to Admission medications   Medication Sig Start Date End Date Taking? Authorizing Provider  amoxicillin-clavulanate (AUGMENTIN) 875-125 MG tablet Take 1 tablet by mouth every 12 (twelve) hours. 09/06/21  Yes Theron Arista, PA-C  HYDROcodone-acetaminophen (NORCO/VICODIN) 5-325 MG tablet Take 2 tablets by mouth every 4 (four) hours as needed. 09/06/21  Yes Theron Arista, PA-C  ondansetron (ZOFRAN ODT) 4 MG disintegrating tablet Take 1 tablet (4 mg total) by mouth every 8 (eight) hours as needed for nausea or vomiting. 09/06/21  Yes Theron Arista, PA-C  clonazePAM (KLONOPIN) 0.5 MG tablet Take 1  tablet (0.5 mg total) by mouth 2 (two) times daily as needed for anxiety. 04/12/12 05/12/12  Burgess Amor, PA-C  divalproex (DEPAKOTE ER) 500 MG 24 hr tablet Take 1 tablet (500 mg total) by mouth daily. 04/12/12 04/12/13  Burgess Amor, PA-C  escitalopram (LEXAPRO) 10 MG tablet Take 10 mg by mouth daily. 01/18/14   [provider]  hydrOXYzine (ATARAX/VISTARIL) 25 MG tablet Take 1 tablet (25 mg total) by mouth every 6 (six) hours. 01/28/14   Trixie Dredge, PA-C  OLANZapine (ZYPREXA) 15 MG tablet Take 15 mg by mouth daily. 01/18/14   [provider]  omeprazole (PRILOSEC) 20 MG capsule Take 20 mg by mouth daily. 12/27/13   [provider]  predniSONE (DELTASONE) 10 MG tablet Take 10 mg by mouth daily. 12/06/13   [provider]  predniSONE (DELTASONE) 20 MG tablet Take 2 tablets (40 mg total) by mouth daily with breakfast. 01/28/14   Trixie Dredge, PA-C  risperiDONE (RISPERDAL) 1 MG tablet Take 1 tablet (1 mg total) by mouth 2 (two) times daily. 04/12/12 05/12/12  Burgess Amor, PA-C  traMADol (ULTRAM) 50 MG tablet Take 50 mg by mouth every 8 (eight) hours as needed. For pain 01/16/14   [provider]    Allergies    Patient has no known allergies.  Review of Systems   Review of Systems  Constitutional:  Negative  for fever.  HENT:  Positive for dental problem.    Physical Exam Updated Vital Signs BP 130/65 (BP Location: Right Arm)   Pulse 73   Temp 98.9 F (37.2 C) (Oral)   Resp 20   Ht 5\' 9"  (1.753 m)   Wt 59 kg   SpO2 96%   BMI 19.20 kg/m   Physical Exam Vitals and nursing note reviewed. Exam conducted with a chaperone present.  Constitutional:      Appearance: Normal appearance.  HENT:     Head: Normocephalic.     Mouth/Throat:     Mouth: Mucous membranes are moist.     Pharynx: No oropharyngeal exudate or posterior oropharyngeal erythema.     Comments: No sublingual tenderness or swelling. Uvula is midline. No trismus. No dry sockets or pulp exposure.  Handling secretions without difficulty. No trismus. Palpable fluctuance to the right posterior jaw. No purulent material expressed with pressure Eyes:     Extraocular Movements: Extraocular movements intact.     Pupils: Pupils are equal, round, and reactive to light.  Cardiovascular:     Rate and Rhythm: Normal rate and regular rhythm.  Pulmonary:     Effort: Pulmonary effort is normal.     Breath sounds: Normal breath sounds.  Musculoskeletal:     Cervical back: Normal range of motion. No rigidity or tenderness.  Neurological:     Mental Status: He is alert.  Psychiatric:        Mood and Affect: Mood normal.    ED Results / Procedures / Treatments   Labs (all labs ordered are listed, but only abnormal results are displayed) Labs Reviewed - No data to display  EKG None  Radiology No results found.  Procedures Procedures   Medications Ordered in ED Medications  HYDROmorphone (DILAUDID) injection 1 mg (has no administration in time range)  ondansetron (ZOFRAN-ODT) disintegrating tablet 8 mg (has no administration in time range)  amoxicillin-clavulanate (AUGMENTIN) 875-125 MG per tablet 1 tablet (has no administration in time range)    ED Course  I have reviewed the triage vital signs and the nursing notes.  Pertinent labs & imaging results that were available during my care of the patient were reviewed by me and considered in my medical decision making (see chart for details).    MDM Rules/Calculators/A&P                           Stable vitals, not febrile or tachycardic.  Not septic.  Patient does have physical exam findings consistent with a dental abscess.  There is no uvula deviation, doubt peritonsillar abscess.  No potato voice, doubt retropharyngeal abscess.  This been going on for couple days, he has tried naproxen with minimal relief so we will provide stronger medicine.  There is nothing to drain based on my exam at this point.  Will prescribe antibiotics and  have him follow-up with a dentist.  No sublingual swelling, doubt Ludewig angina.  Patient discharged in stable condition.  Final Clinical Impression(s) / ED Diagnoses Final diagnoses:  Pain, dental  Dental abscess    Rx / DC Orders ED Discharge Orders          Ordered    ondansetron (ZOFRAN ODT) 4 MG disintegrating tablet  Every 8 hours PRN        09/06/21 1745    amoxicillin-clavulanate (AUGMENTIN) 875-125 MG tablet  Every 12 hours        09/06/21 1745  HYDROcodone-acetaminophen (NORCO/VICODIN) 5-325 MG tablet  Every 4 hours PRN        09/06/21 1745             Theron Arista, Cordelia Poche 09/06/21 1810    Bethann Berkshire, MD 09/07/21 1117

## 2021-09-06 NOTE — ED Triage Notes (Signed)
Right lower dental pain x 2 days 9/10. Has taken Naproxen for pain without relief. Pt does not have a dentist currently.

## 2021-09-06 NOTE — Discharge Instructions (Addendum)
Take Augmentin twice daily for the next 7 days.  The first dose was given today, start taking it again in the morning.  Take the pain medicine sparingly at night before you go to bed.  You can take it with the nausea medicine to help prevent nausea and vomiting.  Call the dentist first thing tomorrow as we discussed.  Return to the ED if you are no longer able to keep down fluids or take the medicine as he will not get better in that case.

## 2023-12-07 ENCOUNTER — Ambulatory Visit: Payer: Medicare HMO | Admitting: Nurse Practitioner

## 2023-12-27 ENCOUNTER — Ambulatory Visit: Payer: Medicare HMO | Admitting: Nurse Practitioner

## 2024-02-01 ENCOUNTER — Other Ambulatory Visit: Payer: Self-pay

## 2024-02-01 ENCOUNTER — Encounter (HOSPITAL_COMMUNITY): Payer: Self-pay

## 2024-02-01 ENCOUNTER — Emergency Department (HOSPITAL_COMMUNITY)
Admission: EM | Admit: 2024-02-01 | Discharge: 2024-02-01 | Discharge status: Against Medical Advice | Attending: Emergency Medicine | Admitting: Emergency Medicine

## 2024-02-01 DIAGNOSIS — Z5321 Procedure and treatment not carried out due to patient leaving prior to being seen by health care provider: Secondary | ICD-10-CM | POA: Diagnosis not present

## 2024-02-01 DIAGNOSIS — R059 Cough, unspecified: Secondary | ICD-10-CM | POA: Insufficient documentation

## 2024-02-01 DIAGNOSIS — R0981 Nasal congestion: Secondary | ICD-10-CM | POA: Insufficient documentation

## 2024-02-01 DIAGNOSIS — R6883 Chills (without fever): Secondary | ICD-10-CM | POA: Insufficient documentation

## 2024-02-01 NOTE — ED Triage Notes (Signed)
 Pt reports:  Congestion "It's on fire." Started yesterday Hot/cold Started yesterday Cough Started yesterday  *Pt does not want a covid test

## 2024-02-01 NOTE — ED Notes (Signed)
 Patient came out of room and stated its been too long and he's leaving. Patient continued to walk out.

## 2024-02-15 ENCOUNTER — Ambulatory Visit: Admitting: Nurse Practitioner
